# Patient Record
Sex: Female | Born: 1945 | Race: White | Hispanic: No | Marital: Married | Smoking: Never smoker
Health system: Southern US, Community
[De-identification: ages and names within clinical notes are randomized; demographics above are authoritative.]

## PROBLEM LIST (undated history)

## (undated) HISTORY — PX: BREAST BIOPSY: SHX20

---

## 1998-11-14 ENCOUNTER — Other Ambulatory Visit: Admission: RE | Admit: 1998-11-14 | Discharge: 1998-11-14 | Payer: Self-pay | Admitting: Internal Medicine

## 1999-01-02 ENCOUNTER — Other Ambulatory Visit: Admission: RE | Admit: 1999-01-02 | Discharge: 1999-01-02 | Payer: Self-pay | Admitting: Radiology

## 1999-11-27 ENCOUNTER — Other Ambulatory Visit: Admission: RE | Admit: 1999-11-27 | Discharge: 1999-11-27 | Payer: Self-pay | Admitting: Internal Medicine

## 2000-12-09 ENCOUNTER — Other Ambulatory Visit: Admission: RE | Admit: 2000-12-09 | Discharge: 2000-12-09 | Payer: Self-pay | Admitting: Internal Medicine

## 2003-10-06 ENCOUNTER — Ambulatory Visit (HOSPITAL_COMMUNITY): Admission: RE | Admit: 2003-10-06 | Discharge: 2003-10-06 | Payer: Self-pay | Admitting: Internal Medicine

## 2008-09-20 ENCOUNTER — Encounter: Admission: RE | Admit: 2008-09-20 | Discharge: 2008-09-20 | Payer: Self-pay | Admitting: Radiology

## 2013-01-01 ENCOUNTER — Other Ambulatory Visit: Payer: Self-pay | Admitting: Radiology

## 2014-11-15 ENCOUNTER — Other Ambulatory Visit: Payer: Self-pay | Admitting: Internal Medicine

## 2014-11-15 DIAGNOSIS — N814 Uterovaginal prolapse, unspecified: Secondary | ICD-10-CM

## 2014-11-17 ENCOUNTER — Ambulatory Visit
Admission: RE | Admit: 2014-11-17 | Discharge: 2014-11-17 | Disposition: A | Discharge status: Home / Self Care | Payer: PPO | Source: Ambulatory Visit | Attending: Internal Medicine | Admitting: Internal Medicine

## 2014-11-17 DIAGNOSIS — N814 Uterovaginal prolapse, unspecified: Secondary | ICD-10-CM

## 2016-03-20 DIAGNOSIS — Z0000 Encounter for general adult medical examination without abnormal findings: Secondary | ICD-10-CM | POA: Diagnosis not present

## 2016-03-20 DIAGNOSIS — D352 Benign neoplasm of pituitary gland: Secondary | ICD-10-CM | POA: Diagnosis not present

## 2016-03-20 DIAGNOSIS — E781 Pure hyperglyceridemia: Secondary | ICD-10-CM | POA: Diagnosis not present

## 2016-03-27 DIAGNOSIS — I8390 Asymptomatic varicose veins of unspecified lower extremity: Secondary | ICD-10-CM | POA: Diagnosis not present

## 2016-03-27 DIAGNOSIS — M25561 Pain in right knee: Secondary | ICD-10-CM | POA: Diagnosis not present

## 2016-03-27 DIAGNOSIS — H4089 Other specified glaucoma: Secondary | ICD-10-CM | POA: Diagnosis not present

## 2016-03-27 DIAGNOSIS — F325 Major depressive disorder, single episode, in full remission: Secondary | ICD-10-CM | POA: Diagnosis not present

## 2016-03-27 DIAGNOSIS — N814 Uterovaginal prolapse, unspecified: Secondary | ICD-10-CM | POA: Diagnosis not present

## 2016-03-27 DIAGNOSIS — Z1389 Encounter for screening for other disorder: Secondary | ICD-10-CM | POA: Diagnosis not present

## 2016-03-27 DIAGNOSIS — E781 Pure hyperglyceridemia: Secondary | ICD-10-CM | POA: Diagnosis not present

## 2016-03-27 DIAGNOSIS — H9193 Unspecified hearing loss, bilateral: Secondary | ICD-10-CM | POA: Diagnosis not present

## 2016-03-27 DIAGNOSIS — D352 Benign neoplasm of pituitary gland: Secondary | ICD-10-CM | POA: Diagnosis not present

## 2016-03-27 DIAGNOSIS — Z6828 Body mass index (BMI) 28.0-28.9, adult: Secondary | ICD-10-CM | POA: Diagnosis not present

## 2016-03-27 DIAGNOSIS — Z1212 Encounter for screening for malignant neoplasm of rectum: Secondary | ICD-10-CM | POA: Diagnosis not present

## 2016-03-27 DIAGNOSIS — Z0000 Encounter for general adult medical examination without abnormal findings: Secondary | ICD-10-CM | POA: Diagnosis not present

## 2016-03-27 DIAGNOSIS — E663 Overweight: Secondary | ICD-10-CM | POA: Diagnosis not present

## 2016-06-15 ENCOUNTER — Other Ambulatory Visit: Payer: Self-pay | Admitting: Internal Medicine

## 2016-06-15 DIAGNOSIS — Z1231 Encounter for screening mammogram for malignant neoplasm of breast: Secondary | ICD-10-CM

## 2016-07-03 ENCOUNTER — Ambulatory Visit
Admission: RE | Admit: 2016-07-03 | Discharge: 2016-07-03 | Disposition: A | Discharge status: Home / Self Care | Payer: PPO | Source: Ambulatory Visit | Attending: Internal Medicine | Admitting: Internal Medicine

## 2016-07-03 DIAGNOSIS — Z1231 Encounter for screening mammogram for malignant neoplasm of breast: Secondary | ICD-10-CM

## 2016-07-10 ENCOUNTER — Other Ambulatory Visit: Payer: Self-pay | Admitting: Internal Medicine

## 2016-07-10 DIAGNOSIS — R928 Other abnormal and inconclusive findings on diagnostic imaging of breast: Secondary | ICD-10-CM

## 2016-07-13 ENCOUNTER — Ambulatory Visit
Admission: RE | Admit: 2016-07-13 | Discharge: 2016-07-13 | Disposition: A | Discharge status: Home / Self Care | Payer: PPO | Source: Ambulatory Visit | Attending: Internal Medicine | Admitting: Internal Medicine

## 2016-07-13 DIAGNOSIS — R928 Other abnormal and inconclusive findings on diagnostic imaging of breast: Secondary | ICD-10-CM

## 2016-08-08 DIAGNOSIS — H2513 Age-related nuclear cataract, bilateral: Secondary | ICD-10-CM | POA: Diagnosis not present

## 2016-08-08 DIAGNOSIS — H401131 Primary open-angle glaucoma, bilateral, mild stage: Secondary | ICD-10-CM | POA: Diagnosis not present

## 2016-08-08 DIAGNOSIS — H18413 Arcus senilis, bilateral: Secondary | ICD-10-CM | POA: Diagnosis not present

## 2017-01-04 DIAGNOSIS — M1711 Unilateral primary osteoarthritis, right knee: Secondary | ICD-10-CM | POA: Diagnosis not present

## 2017-02-22 DIAGNOSIS — M1711 Unilateral primary osteoarthritis, right knee: Secondary | ICD-10-CM | POA: Diagnosis not present

## 2017-03-21 DIAGNOSIS — M1711 Unilateral primary osteoarthritis, right knee: Secondary | ICD-10-CM | POA: Diagnosis not present

## 2017-04-18 DIAGNOSIS — M810 Age-related osteoporosis without current pathological fracture: Secondary | ICD-10-CM | POA: Diagnosis not present

## 2017-04-18 DIAGNOSIS — F418 Other specified anxiety disorders: Secondary | ICD-10-CM | POA: Diagnosis not present

## 2017-04-18 DIAGNOSIS — N39498 Other specified urinary incontinence: Secondary | ICD-10-CM | POA: Diagnosis not present

## 2017-04-18 DIAGNOSIS — E781 Pure hyperglyceridemia: Secondary | ICD-10-CM | POA: Diagnosis not present

## 2017-04-18 DIAGNOSIS — D352 Benign neoplasm of pituitary gland: Secondary | ICD-10-CM | POA: Diagnosis not present

## 2017-04-25 DIAGNOSIS — D352 Benign neoplasm of pituitary gland: Secondary | ICD-10-CM | POA: Diagnosis not present

## 2017-04-25 DIAGNOSIS — H9190 Unspecified hearing loss, unspecified ear: Secondary | ICD-10-CM | POA: Diagnosis not present

## 2017-04-25 DIAGNOSIS — N814 Uterovaginal prolapse, unspecified: Secondary | ICD-10-CM | POA: Diagnosis not present

## 2017-04-25 DIAGNOSIS — F325 Major depressive disorder, single episode, in full remission: Secondary | ICD-10-CM | POA: Diagnosis not present

## 2017-04-25 DIAGNOSIS — I8390 Asymptomatic varicose veins of unspecified lower extremity: Secondary | ICD-10-CM | POA: Diagnosis not present

## 2017-04-25 DIAGNOSIS — Z1389 Encounter for screening for other disorder: Secondary | ICD-10-CM | POA: Diagnosis not present

## 2017-04-25 DIAGNOSIS — M25561 Pain in right knee: Secondary | ICD-10-CM | POA: Diagnosis not present

## 2017-04-25 DIAGNOSIS — Z0000 Encounter for general adult medical examination without abnormal findings: Secondary | ICD-10-CM | POA: Diagnosis not present

## 2017-04-25 DIAGNOSIS — H4089 Other specified glaucoma: Secondary | ICD-10-CM | POA: Diagnosis not present

## 2017-04-25 DIAGNOSIS — F418 Other specified anxiety disorders: Secondary | ICD-10-CM | POA: Diagnosis not present

## 2017-04-25 DIAGNOSIS — Z6827 Body mass index (BMI) 27.0-27.9, adult: Secondary | ICD-10-CM | POA: Diagnosis not present

## 2017-04-25 DIAGNOSIS — R140 Abdominal distension (gaseous): Secondary | ICD-10-CM | POA: Diagnosis not present

## 2017-04-30 DIAGNOSIS — Z1212 Encounter for screening for malignant neoplasm of rectum: Secondary | ICD-10-CM | POA: Diagnosis not present

## 2017-05-09 DIAGNOSIS — M1711 Unilateral primary osteoarthritis, right knee: Secondary | ICD-10-CM | POA: Diagnosis not present

## 2017-07-09 ENCOUNTER — Other Ambulatory Visit: Payer: Self-pay | Admitting: Internal Medicine

## 2017-07-09 DIAGNOSIS — Z1231 Encounter for screening mammogram for malignant neoplasm of breast: Secondary | ICD-10-CM

## 2017-07-17 ENCOUNTER — Ambulatory Visit
Admission: RE | Admit: 2017-07-17 | Discharge: 2017-07-17 | Disposition: A | Discharge status: Home / Self Care | Payer: PPO | Source: Ambulatory Visit | Attending: Internal Medicine | Admitting: Internal Medicine

## 2017-07-17 DIAGNOSIS — Z1231 Encounter for screening mammogram for malignant neoplasm of breast: Secondary | ICD-10-CM | POA: Diagnosis not present

## 2017-09-24 DIAGNOSIS — D1801 Hemangioma of skin and subcutaneous tissue: Secondary | ICD-10-CM | POA: Diagnosis not present

## 2017-09-24 DIAGNOSIS — L814 Other melanin hyperpigmentation: Secondary | ICD-10-CM | POA: Diagnosis not present

## 2017-09-24 DIAGNOSIS — L821 Other seborrheic keratosis: Secondary | ICD-10-CM | POA: Diagnosis not present

## 2017-09-24 DIAGNOSIS — L570 Actinic keratosis: Secondary | ICD-10-CM | POA: Diagnosis not present

## 2017-09-24 DIAGNOSIS — D225 Melanocytic nevi of trunk: Secondary | ICD-10-CM | POA: Diagnosis not present

## 2017-10-23 DIAGNOSIS — M1711 Unilateral primary osteoarthritis, right knee: Secondary | ICD-10-CM | POA: Diagnosis not present

## 2017-10-30 DIAGNOSIS — M1711 Unilateral primary osteoarthritis, right knee: Secondary | ICD-10-CM | POA: Diagnosis not present

## 2017-11-06 DIAGNOSIS — M1711 Unilateral primary osteoarthritis, right knee: Secondary | ICD-10-CM | POA: Diagnosis not present

## 2017-11-13 DIAGNOSIS — H18413 Arcus senilis, bilateral: Secondary | ICD-10-CM | POA: Diagnosis not present

## 2017-11-13 DIAGNOSIS — H2513 Age-related nuclear cataract, bilateral: Secondary | ICD-10-CM | POA: Diagnosis not present

## 2017-11-13 DIAGNOSIS — H401131 Primary open-angle glaucoma, bilateral, mild stage: Secondary | ICD-10-CM | POA: Diagnosis not present

## 2018-01-24 DIAGNOSIS — Z86010 Personal history of colonic polyps: Secondary | ICD-10-CM | POA: Diagnosis not present

## 2018-04-29 DIAGNOSIS — E781 Pure hyperglyceridemia: Secondary | ICD-10-CM | POA: Diagnosis not present

## 2018-04-29 DIAGNOSIS — M810 Age-related osteoporosis without current pathological fracture: Secondary | ICD-10-CM | POA: Diagnosis not present

## 2018-04-29 DIAGNOSIS — D352 Benign neoplasm of pituitary gland: Secondary | ICD-10-CM | POA: Diagnosis not present

## 2018-04-29 DIAGNOSIS — R82998 Other abnormal findings in urine: Secondary | ICD-10-CM | POA: Diagnosis not present

## 2018-05-06 DIAGNOSIS — Z0000 Encounter for general adult medical examination without abnormal findings: Secondary | ICD-10-CM | POA: Diagnosis not present

## 2018-05-06 DIAGNOSIS — N814 Uterovaginal prolapse, unspecified: Secondary | ICD-10-CM | POA: Diagnosis not present

## 2018-05-06 DIAGNOSIS — L290 Pruritus ani: Secondary | ICD-10-CM | POA: Diagnosis not present

## 2018-05-06 DIAGNOSIS — H9193 Unspecified hearing loss, bilateral: Secondary | ICD-10-CM | POA: Diagnosis not present

## 2018-05-06 DIAGNOSIS — Z23 Encounter for immunization: Secondary | ICD-10-CM | POA: Diagnosis not present

## 2018-05-06 DIAGNOSIS — D352 Benign neoplasm of pituitary gland: Secondary | ICD-10-CM | POA: Diagnosis not present

## 2018-05-06 DIAGNOSIS — B351 Tinea unguium: Secondary | ICD-10-CM | POA: Diagnosis not present

## 2018-05-06 DIAGNOSIS — F418 Other specified anxiety disorders: Secondary | ICD-10-CM | POA: Diagnosis not present

## 2018-05-06 DIAGNOSIS — I8390 Asymptomatic varicose veins of unspecified lower extremity: Secondary | ICD-10-CM | POA: Diagnosis not present

## 2018-05-06 DIAGNOSIS — M25561 Pain in right knee: Secondary | ICD-10-CM | POA: Diagnosis not present

## 2018-05-06 DIAGNOSIS — E663 Overweight: Secondary | ICD-10-CM | POA: Diagnosis not present

## 2018-05-06 DIAGNOSIS — F325 Major depressive disorder, single episode, in full remission: Secondary | ICD-10-CM | POA: Diagnosis not present

## 2018-07-16 DIAGNOSIS — H903 Sensorineural hearing loss, bilateral: Secondary | ICD-10-CM | POA: Diagnosis not present

## 2018-08-21 ENCOUNTER — Other Ambulatory Visit: Payer: Self-pay | Admitting: Internal Medicine

## 2018-08-21 DIAGNOSIS — Z1231 Encounter for screening mammogram for malignant neoplasm of breast: Secondary | ICD-10-CM

## 2018-09-17 DIAGNOSIS — M170 Bilateral primary osteoarthritis of knee: Secondary | ICD-10-CM | POA: Diagnosis not present

## 2018-09-17 DIAGNOSIS — M1711 Unilateral primary osteoarthritis, right knee: Secondary | ICD-10-CM | POA: Diagnosis not present

## 2018-09-17 DIAGNOSIS — M1712 Unilateral primary osteoarthritis, left knee: Secondary | ICD-10-CM | POA: Diagnosis not present

## 2018-09-17 DIAGNOSIS — M25562 Pain in left knee: Secondary | ICD-10-CM | POA: Diagnosis not present

## 2018-09-24 DIAGNOSIS — M1711 Unilateral primary osteoarthritis, right knee: Secondary | ICD-10-CM | POA: Diagnosis not present

## 2018-09-26 ENCOUNTER — Encounter: Payer: Self-pay | Admitting: Radiology

## 2018-09-26 ENCOUNTER — Ambulatory Visit
Admission: RE | Admit: 2018-09-26 | Discharge: 2018-09-26 | Disposition: A | Discharge status: Home / Self Care | Payer: PPO | Source: Ambulatory Visit | Attending: Internal Medicine | Admitting: Internal Medicine

## 2018-09-26 DIAGNOSIS — Z1231 Encounter for screening mammogram for malignant neoplasm of breast: Secondary | ICD-10-CM | POA: Diagnosis not present

## 2018-09-30 ENCOUNTER — Other Ambulatory Visit: Payer: Self-pay | Admitting: Internal Medicine

## 2018-09-30 DIAGNOSIS — R928 Other abnormal and inconclusive findings on diagnostic imaging of breast: Secondary | ICD-10-CM

## 2018-10-01 DIAGNOSIS — M1711 Unilateral primary osteoarthritis, right knee: Secondary | ICD-10-CM | POA: Diagnosis not present

## 2018-10-06 ENCOUNTER — Ambulatory Visit
Admission: RE | Admit: 2018-10-06 | Discharge: 2018-10-06 | Disposition: A | Discharge status: Home / Self Care | Payer: PPO | Source: Ambulatory Visit | Attending: Internal Medicine | Admitting: Internal Medicine

## 2018-10-06 DIAGNOSIS — R928 Other abnormal and inconclusive findings on diagnostic imaging of breast: Secondary | ICD-10-CM

## 2018-10-06 DIAGNOSIS — N6002 Solitary cyst of left breast: Secondary | ICD-10-CM | POA: Diagnosis not present

## 2018-10-22 DIAGNOSIS — M1712 Unilateral primary osteoarthritis, left knee: Secondary | ICD-10-CM | POA: Diagnosis not present

## 2018-10-29 DIAGNOSIS — M1712 Unilateral primary osteoarthritis, left knee: Secondary | ICD-10-CM | POA: Diagnosis not present

## 2018-11-06 DIAGNOSIS — M1712 Unilateral primary osteoarthritis, left knee: Secondary | ICD-10-CM | POA: Diagnosis not present

## 2018-12-01 DIAGNOSIS — L989 Disorder of the skin and subcutaneous tissue, unspecified: Secondary | ICD-10-CM | POA: Diagnosis not present

## 2018-12-01 DIAGNOSIS — L720 Epidermal cyst: Secondary | ICD-10-CM | POA: Diagnosis not present

## 2018-12-01 DIAGNOSIS — D1801 Hemangioma of skin and subcutaneous tissue: Secondary | ICD-10-CM | POA: Diagnosis not present

## 2018-12-01 DIAGNOSIS — L821 Other seborrheic keratosis: Secondary | ICD-10-CM | POA: Diagnosis not present

## 2018-12-01 DIAGNOSIS — D225 Melanocytic nevi of trunk: Secondary | ICD-10-CM | POA: Diagnosis not present

## 2018-12-01 DIAGNOSIS — C44311 Basal cell carcinoma of skin of nose: Secondary | ICD-10-CM | POA: Diagnosis not present

## 2018-12-01 DIAGNOSIS — D485 Neoplasm of uncertain behavior of skin: Secondary | ICD-10-CM | POA: Diagnosis not present

## 2019-04-16 DIAGNOSIS — R351 Nocturia: Secondary | ICD-10-CM | POA: Diagnosis not present

## 2019-04-16 DIAGNOSIS — N8111 Cystocele, midline: Secondary | ICD-10-CM | POA: Diagnosis not present

## 2019-04-16 DIAGNOSIS — R350 Frequency of micturition: Secondary | ICD-10-CM | POA: Diagnosis not present

## 2019-04-16 DIAGNOSIS — N3946 Mixed incontinence: Secondary | ICD-10-CM | POA: Diagnosis not present

## 2019-05-01 DIAGNOSIS — E781 Pure hyperglyceridemia: Secondary | ICD-10-CM | POA: Diagnosis not present

## 2019-05-01 DIAGNOSIS — M810 Age-related osteoporosis without current pathological fracture: Secondary | ICD-10-CM | POA: Diagnosis not present

## 2019-05-01 DIAGNOSIS — R7989 Other specified abnormal findings of blood chemistry: Secondary | ICD-10-CM | POA: Diagnosis not present

## 2019-05-04 DIAGNOSIS — D352 Benign neoplasm of pituitary gland: Secondary | ICD-10-CM | POA: Diagnosis not present

## 2019-05-05 DIAGNOSIS — R82998 Other abnormal findings in urine: Secondary | ICD-10-CM | POA: Diagnosis not present

## 2019-05-08 DIAGNOSIS — E663 Overweight: Secondary | ICD-10-CM | POA: Diagnosis not present

## 2019-05-08 DIAGNOSIS — Z1331 Encounter for screening for depression: Secondary | ICD-10-CM | POA: Diagnosis not present

## 2019-05-08 DIAGNOSIS — M1990 Unspecified osteoarthritis, unspecified site: Secondary | ICD-10-CM | POA: Diagnosis not present

## 2019-05-08 DIAGNOSIS — Z0000 Encounter for general adult medical examination without abnormal findings: Secondary | ICD-10-CM | POA: Diagnosis not present

## 2019-05-08 DIAGNOSIS — F419 Anxiety disorder, unspecified: Secondary | ICD-10-CM | POA: Diagnosis not present

## 2019-05-08 DIAGNOSIS — F325 Major depressive disorder, single episode, in full remission: Secondary | ICD-10-CM | POA: Diagnosis not present

## 2019-05-08 DIAGNOSIS — N814 Uterovaginal prolapse, unspecified: Secondary | ICD-10-CM | POA: Diagnosis not present

## 2019-05-08 DIAGNOSIS — M1712 Unilateral primary osteoarthritis, left knee: Secondary | ICD-10-CM | POA: Diagnosis not present

## 2019-05-08 DIAGNOSIS — H9190 Unspecified hearing loss, unspecified ear: Secondary | ICD-10-CM | POA: Diagnosis not present

## 2019-05-08 DIAGNOSIS — R140 Abdominal distension (gaseous): Secondary | ICD-10-CM | POA: Diagnosis not present

## 2019-05-08 DIAGNOSIS — M1711 Unilateral primary osteoarthritis, right knee: Secondary | ICD-10-CM | POA: Diagnosis not present

## 2019-05-08 DIAGNOSIS — D352 Benign neoplasm of pituitary gland: Secondary | ICD-10-CM | POA: Diagnosis not present

## 2019-05-08 DIAGNOSIS — M170 Bilateral primary osteoarthritis of knee: Secondary | ICD-10-CM | POA: Diagnosis not present

## 2019-05-08 DIAGNOSIS — H409 Unspecified glaucoma: Secondary | ICD-10-CM | POA: Diagnosis not present

## 2019-05-08 DIAGNOSIS — I8390 Asymptomatic varicose veins of unspecified lower extremity: Secondary | ICD-10-CM | POA: Diagnosis not present

## 2019-05-08 DIAGNOSIS — E781 Pure hyperglyceridemia: Secondary | ICD-10-CM | POA: Diagnosis not present

## 2019-05-08 DIAGNOSIS — Z1339 Encounter for screening examination for other mental health and behavioral disorders: Secondary | ICD-10-CM | POA: Diagnosis not present

## 2019-05-14 DIAGNOSIS — M25561 Pain in right knee: Secondary | ICD-10-CM | POA: Diagnosis not present

## 2019-05-14 DIAGNOSIS — M170 Bilateral primary osteoarthritis of knee: Secondary | ICD-10-CM | POA: Diagnosis not present

## 2019-05-14 DIAGNOSIS — M1712 Unilateral primary osteoarthritis, left knee: Secondary | ICD-10-CM | POA: Diagnosis not present

## 2019-05-14 DIAGNOSIS — M25562 Pain in left knee: Secondary | ICD-10-CM | POA: Diagnosis not present

## 2019-05-14 DIAGNOSIS — M1711 Unilateral primary osteoarthritis, right knee: Secondary | ICD-10-CM | POA: Diagnosis not present

## 2019-05-22 DIAGNOSIS — M25562 Pain in left knee: Secondary | ICD-10-CM | POA: Diagnosis not present

## 2019-05-22 DIAGNOSIS — M170 Bilateral primary osteoarthritis of knee: Secondary | ICD-10-CM | POA: Diagnosis not present

## 2019-05-22 DIAGNOSIS — M1712 Unilateral primary osteoarthritis, left knee: Secondary | ICD-10-CM | POA: Diagnosis not present

## 2019-05-22 DIAGNOSIS — M25561 Pain in right knee: Secondary | ICD-10-CM | POA: Diagnosis not present

## 2019-05-22 DIAGNOSIS — M1711 Unilateral primary osteoarthritis, right knee: Secondary | ICD-10-CM | POA: Diagnosis not present

## 2019-05-26 DIAGNOSIS — H903 Sensorineural hearing loss, bilateral: Secondary | ICD-10-CM | POA: Diagnosis not present

## 2019-08-24 DIAGNOSIS — H401131 Primary open-angle glaucoma, bilateral, mild stage: Secondary | ICD-10-CM | POA: Diagnosis not present

## 2019-08-24 DIAGNOSIS — H2513 Age-related nuclear cataract, bilateral: Secondary | ICD-10-CM | POA: Diagnosis not present

## 2019-09-07 ENCOUNTER — Other Ambulatory Visit: Payer: Self-pay | Admitting: Internal Medicine

## 2019-09-07 DIAGNOSIS — N6002 Solitary cyst of left breast: Secondary | ICD-10-CM

## 2019-10-07 ENCOUNTER — Other Ambulatory Visit: Payer: Self-pay

## 2019-10-07 ENCOUNTER — Ambulatory Visit
Admission: RE | Admit: 2019-10-07 | Discharge: 2019-10-07 | Disposition: A | Discharge status: Home / Self Care | Payer: PPO | Source: Ambulatory Visit | Attending: Internal Medicine | Admitting: Internal Medicine

## 2019-10-07 DIAGNOSIS — R928 Other abnormal and inconclusive findings on diagnostic imaging of breast: Secondary | ICD-10-CM | POA: Diagnosis not present

## 2019-10-07 DIAGNOSIS — N6002 Solitary cyst of left breast: Secondary | ICD-10-CM

## 2020-03-15 DIAGNOSIS — L821 Other seborrheic keratosis: Secondary | ICD-10-CM | POA: Diagnosis not present

## 2020-03-15 DIAGNOSIS — Z85828 Personal history of other malignant neoplasm of skin: Secondary | ICD-10-CM | POA: Diagnosis not present

## 2020-03-15 DIAGNOSIS — D225 Melanocytic nevi of trunk: Secondary | ICD-10-CM | POA: Diagnosis not present

## 2020-03-15 DIAGNOSIS — D485 Neoplasm of uncertain behavior of skin: Secondary | ICD-10-CM | POA: Diagnosis not present

## 2020-03-15 DIAGNOSIS — L720 Epidermal cyst: Secondary | ICD-10-CM | POA: Diagnosis not present

## 2020-03-15 DIAGNOSIS — C44319 Basal cell carcinoma of skin of other parts of face: Secondary | ICD-10-CM | POA: Diagnosis not present

## 2020-03-15 DIAGNOSIS — L905 Scar conditions and fibrosis of skin: Secondary | ICD-10-CM | POA: Diagnosis not present

## 2020-03-15 DIAGNOSIS — L570 Actinic keratosis: Secondary | ICD-10-CM | POA: Diagnosis not present

## 2020-05-04 ENCOUNTER — Institutional Professional Consult (permissible substitution): Payer: PPO | Admitting: Plastic Surgery

## 2020-05-12 DIAGNOSIS — C44319 Basal cell carcinoma of skin of other parts of face: Secondary | ICD-10-CM | POA: Diagnosis not present

## 2020-07-06 ENCOUNTER — Institutional Professional Consult (permissible substitution): Payer: PPO | Admitting: Plastic Surgery

## 2020-07-28 ENCOUNTER — Institutional Professional Consult (permissible substitution): Payer: PPO | Admitting: Plastic Surgery

## 2020-08-26 ENCOUNTER — Other Ambulatory Visit: Payer: Self-pay | Admitting: Internal Medicine

## 2020-08-26 DIAGNOSIS — Z1231 Encounter for screening mammogram for malignant neoplasm of breast: Secondary | ICD-10-CM

## 2020-08-31 ENCOUNTER — Encounter: Payer: Self-pay | Admitting: Plastic Surgery

## 2020-08-31 ENCOUNTER — Other Ambulatory Visit: Payer: Self-pay

## 2020-08-31 ENCOUNTER — Ambulatory Visit: Payer: PPO | Admitting: Plastic Surgery

## 2020-08-31 VITALS — BP 143/84 | HR 80 | Temp 97.9°F | Ht 64.0 in | Wt 154.8 lb

## 2020-08-31 DIAGNOSIS — L723 Sebaceous cyst: Secondary | ICD-10-CM | POA: Diagnosis not present

## 2020-08-31 NOTE — Progress Notes (Signed)
   Referring Provider Shon Baton, MD [address] [place],  [place] [address]   CC:  Chief Complaint  Patient presents with  . Advice Only      Tina Castillo is an 74 y.o. female.  HPI: Patient presents with a lesion on her left neck.  Is been present for years.  Intermittently she would squeeze it and she would get some thick fluid from it and then at one point it grew very large in size and she drained it herself with a needle actually.  Since then it has gotten quite a bit smaller but is still bothering her and she is afraid of it getting infected and getting bigger again in the future.  She would like to have it removed.  She has had several cyst removed from her scalp.  No Known Allergies  Outpatient Encounter Medications as of 08/31/2020  Medication Sig  . Multiple Vitamin (MULTIVITAMIN) tablet Take 1 tablet by mouth every other day.  Marland Kitchen OVER THE COUNTER MEDICATION Liquid Turmic-Daily  . OVER THE COUNTER MEDICATION Magnesium 250mg -Take 1 tablet by mouth 2x/week   No facility-administered encounter medications on file as of 08/31/2020.     No past medical history on file.  No past surgical history on file.  No family history on file.  Social History   Social History Narrative  . Not on file     Review of Systems General: Denies fevers, chills, weight loss CV: Denies chest pain, shortness of breath, palpitations  Physical Exam Vitals with BMI 08/31/2020  Height 5\' 4"   Weight 154 lbs 13 oz  BMI 00.45  Systolic 997  Diastolic 84  Pulse 80    General:  No acute distress,  Alert and oriented, Non-Toxic, Normal speech and affect Examination shows a 2.5 cm cystic lesion of the left neck.  This feels just beneath the skin and it has a bit of a punctum centrally.  There is no other surrounding fluctuance or skin changes.  Assessment/Plan Patient presents with a superficial cystic lesion in the left neck.  We discussed excision.  We discussed the risk include  bleeding, infection, damage to surrounding structures and need for additional procedures.  All of her questions were answered and we will plan to excise this in the office under local.  Cindra Presume 08/31/2020, 9:44 AM

## 2020-09-23 DIAGNOSIS — E781 Pure hyperglyceridemia: Secondary | ICD-10-CM | POA: Diagnosis not present

## 2020-09-23 DIAGNOSIS — E559 Vitamin D deficiency, unspecified: Secondary | ICD-10-CM | POA: Diagnosis not present

## 2020-09-30 DIAGNOSIS — D352 Benign neoplasm of pituitary gland: Secondary | ICD-10-CM | POA: Diagnosis not present

## 2020-09-30 DIAGNOSIS — M810 Age-related osteoporosis without current pathological fracture: Secondary | ICD-10-CM | POA: Diagnosis not present

## 2020-09-30 DIAGNOSIS — N39498 Other specified urinary incontinence: Secondary | ICD-10-CM | POA: Diagnosis not present

## 2020-09-30 DIAGNOSIS — Z23 Encounter for immunization: Secondary | ICD-10-CM | POA: Diagnosis not present

## 2020-09-30 DIAGNOSIS — Z0000 Encounter for general adult medical examination without abnormal findings: Secondary | ICD-10-CM | POA: Diagnosis not present

## 2020-09-30 DIAGNOSIS — E663 Overweight: Secondary | ICD-10-CM | POA: Diagnosis not present

## 2020-09-30 DIAGNOSIS — F325 Major depressive disorder, single episode, in full remission: Secondary | ICD-10-CM | POA: Diagnosis not present

## 2020-09-30 DIAGNOSIS — E781 Pure hyperglyceridemia: Secondary | ICD-10-CM | POA: Diagnosis not present

## 2020-09-30 DIAGNOSIS — R82998 Other abnormal findings in urine: Secondary | ICD-10-CM | POA: Diagnosis not present

## 2020-09-30 DIAGNOSIS — M1990 Unspecified osteoarthritis, unspecified site: Secondary | ICD-10-CM | POA: Diagnosis not present

## 2020-09-30 DIAGNOSIS — N814 Uterovaginal prolapse, unspecified: Secondary | ICD-10-CM | POA: Diagnosis not present

## 2020-09-30 DIAGNOSIS — F419 Anxiety disorder, unspecified: Secondary | ICD-10-CM | POA: Diagnosis not present

## 2020-10-05 DIAGNOSIS — Z1212 Encounter for screening for malignant neoplasm of rectum: Secondary | ICD-10-CM | POA: Diagnosis not present

## 2020-10-11 ENCOUNTER — Ambulatory Visit
Admission: RE | Admit: 2020-10-11 | Discharge: 2020-10-11 | Disposition: A | Discharge status: Home / Self Care | Payer: PPO | Source: Ambulatory Visit | Attending: Internal Medicine | Admitting: Internal Medicine

## 2020-10-11 ENCOUNTER — Other Ambulatory Visit: Payer: Self-pay

## 2020-10-11 DIAGNOSIS — Z1231 Encounter for screening mammogram for malignant neoplasm of breast: Secondary | ICD-10-CM | POA: Diagnosis not present

## 2020-10-12 DIAGNOSIS — L718 Other rosacea: Secondary | ICD-10-CM | POA: Diagnosis not present

## 2020-10-12 DIAGNOSIS — L905 Scar conditions and fibrosis of skin: Secondary | ICD-10-CM | POA: Diagnosis not present

## 2020-10-12 DIAGNOSIS — D225 Melanocytic nevi of trunk: Secondary | ICD-10-CM | POA: Diagnosis not present

## 2020-10-12 DIAGNOSIS — L570 Actinic keratosis: Secondary | ICD-10-CM | POA: Diagnosis not present

## 2020-10-12 DIAGNOSIS — Z85828 Personal history of other malignant neoplasm of skin: Secondary | ICD-10-CM | POA: Diagnosis not present

## 2020-10-12 DIAGNOSIS — L821 Other seborrheic keratosis: Secondary | ICD-10-CM | POA: Diagnosis not present

## 2020-10-13 ENCOUNTER — Ambulatory Visit: Payer: PPO | Admitting: Plastic Surgery

## 2020-10-18 DIAGNOSIS — H401131 Primary open-angle glaucoma, bilateral, mild stage: Secondary | ICD-10-CM | POA: Diagnosis not present

## 2020-10-18 DIAGNOSIS — H2513 Age-related nuclear cataract, bilateral: Secondary | ICD-10-CM | POA: Diagnosis not present

## 2021-01-23 DIAGNOSIS — M170 Bilateral primary osteoarthritis of knee: Secondary | ICD-10-CM | POA: Diagnosis not present

## 2021-01-23 DIAGNOSIS — M1711 Unilateral primary osteoarthritis, right knee: Secondary | ICD-10-CM | POA: Diagnosis not present

## 2021-01-23 DIAGNOSIS — M1712 Unilateral primary osteoarthritis, left knee: Secondary | ICD-10-CM | POA: Diagnosis not present

## 2021-02-03 DIAGNOSIS — M1712 Unilateral primary osteoarthritis, left knee: Secondary | ICD-10-CM | POA: Diagnosis not present

## 2021-02-17 DIAGNOSIS — E781 Pure hyperglyceridemia: Secondary | ICD-10-CM | POA: Diagnosis not present

## 2021-02-17 DIAGNOSIS — Z01810 Encounter for preprocedural cardiovascular examination: Secondary | ICD-10-CM | POA: Diagnosis not present

## 2021-02-17 DIAGNOSIS — I8390 Asymptomatic varicose veins of unspecified lower extremity: Secondary | ICD-10-CM | POA: Diagnosis not present

## 2021-02-17 DIAGNOSIS — R030 Elevated blood-pressure reading, without diagnosis of hypertension: Secondary | ICD-10-CM | POA: Diagnosis not present

## 2021-02-17 DIAGNOSIS — D352 Benign neoplasm of pituitary gland: Secondary | ICD-10-CM | POA: Diagnosis not present

## 2021-02-17 DIAGNOSIS — N39498 Other specified urinary incontinence: Secondary | ICD-10-CM | POA: Diagnosis not present

## 2021-02-17 DIAGNOSIS — R002 Palpitations: Secondary | ICD-10-CM | POA: Diagnosis not present

## 2021-02-17 DIAGNOSIS — M1712 Unilateral primary osteoarthritis, left knee: Secondary | ICD-10-CM | POA: Diagnosis not present

## 2021-02-17 DIAGNOSIS — E663 Overweight: Secondary | ICD-10-CM | POA: Diagnosis not present

## 2021-02-17 DIAGNOSIS — N814 Uterovaginal prolapse, unspecified: Secondary | ICD-10-CM | POA: Diagnosis not present

## 2021-02-17 DIAGNOSIS — Z01818 Encounter for other preprocedural examination: Secondary | ICD-10-CM | POA: Diagnosis not present

## 2021-02-17 DIAGNOSIS — M1990 Unspecified osteoarthritis, unspecified site: Secondary | ICD-10-CM | POA: Diagnosis not present

## 2021-02-17 DIAGNOSIS — M810 Age-related osteoporosis without current pathological fracture: Secondary | ICD-10-CM | POA: Diagnosis not present

## 2021-02-21 DIAGNOSIS — M25562 Pain in left knee: Secondary | ICD-10-CM | POA: Diagnosis not present

## 2021-02-21 DIAGNOSIS — M25662 Stiffness of left knee, not elsewhere classified: Secondary | ICD-10-CM | POA: Diagnosis not present

## 2021-02-21 DIAGNOSIS — M1712 Unilateral primary osteoarthritis, left knee: Secondary | ICD-10-CM | POA: Diagnosis not present

## 2021-03-28 DIAGNOSIS — M1712 Unilateral primary osteoarthritis, left knee: Secondary | ICD-10-CM | POA: Diagnosis not present

## 2021-03-28 DIAGNOSIS — G8918 Other acute postprocedural pain: Secondary | ICD-10-CM | POA: Diagnosis not present

## 2021-03-31 DIAGNOSIS — M25562 Pain in left knee: Secondary | ICD-10-CM | POA: Diagnosis not present

## 2021-03-31 DIAGNOSIS — M25662 Stiffness of left knee, not elsewhere classified: Secondary | ICD-10-CM | POA: Diagnosis not present

## 2021-04-04 DIAGNOSIS — M25662 Stiffness of left knee, not elsewhere classified: Secondary | ICD-10-CM | POA: Diagnosis not present

## 2021-04-04 DIAGNOSIS — M25562 Pain in left knee: Secondary | ICD-10-CM | POA: Diagnosis not present

## 2021-04-07 DIAGNOSIS — M25562 Pain in left knee: Secondary | ICD-10-CM | POA: Diagnosis not present

## 2021-04-07 DIAGNOSIS — M25662 Stiffness of left knee, not elsewhere classified: Secondary | ICD-10-CM | POA: Diagnosis not present

## 2021-04-11 DIAGNOSIS — M25562 Pain in left knee: Secondary | ICD-10-CM | POA: Diagnosis not present

## 2021-04-11 DIAGNOSIS — M25662 Stiffness of left knee, not elsewhere classified: Secondary | ICD-10-CM | POA: Diagnosis not present

## 2021-04-13 DIAGNOSIS — M25662 Stiffness of left knee, not elsewhere classified: Secondary | ICD-10-CM | POA: Diagnosis not present

## 2021-04-13 DIAGNOSIS — M25562 Pain in left knee: Secondary | ICD-10-CM | POA: Diagnosis not present

## 2021-04-17 DIAGNOSIS — M25562 Pain in left knee: Secondary | ICD-10-CM | POA: Diagnosis not present

## 2021-04-17 DIAGNOSIS — M25662 Stiffness of left knee, not elsewhere classified: Secondary | ICD-10-CM | POA: Diagnosis not present

## 2021-04-19 DIAGNOSIS — M25562 Pain in left knee: Secondary | ICD-10-CM | POA: Diagnosis not present

## 2021-04-19 DIAGNOSIS — M25662 Stiffness of left knee, not elsewhere classified: Secondary | ICD-10-CM | POA: Diagnosis not present

## 2021-04-24 DIAGNOSIS — M25562 Pain in left knee: Secondary | ICD-10-CM | POA: Diagnosis not present

## 2021-04-24 DIAGNOSIS — M25662 Stiffness of left knee, not elsewhere classified: Secondary | ICD-10-CM | POA: Diagnosis not present

## 2021-04-27 DIAGNOSIS — M25562 Pain in left knee: Secondary | ICD-10-CM | POA: Diagnosis not present

## 2021-04-27 DIAGNOSIS — M25662 Stiffness of left knee, not elsewhere classified: Secondary | ICD-10-CM | POA: Diagnosis not present

## 2021-05-01 DIAGNOSIS — M25662 Stiffness of left knee, not elsewhere classified: Secondary | ICD-10-CM | POA: Diagnosis not present

## 2021-05-01 DIAGNOSIS — M25562 Pain in left knee: Secondary | ICD-10-CM | POA: Diagnosis not present

## 2021-05-04 DIAGNOSIS — M25562 Pain in left knee: Secondary | ICD-10-CM | POA: Diagnosis not present

## 2021-05-04 DIAGNOSIS — M25662 Stiffness of left knee, not elsewhere classified: Secondary | ICD-10-CM | POA: Diagnosis not present

## 2021-05-09 DIAGNOSIS — Z471 Aftercare following joint replacement surgery: Secondary | ICD-10-CM | POA: Diagnosis not present

## 2021-05-09 DIAGNOSIS — Z96652 Presence of left artificial knee joint: Secondary | ICD-10-CM | POA: Diagnosis not present

## 2021-06-21 IMAGING — MG DIGITAL SCREENING BILAT W/ TOMO W/ CAD
6 of 10 series · 6 of 30 positions shown · non-contrast
Comparison: Previous exam(s).

CLINICAL DATA: Screening.

EXAM:
DIGITAL SCREENING BILATERAL MAMMOGRAM WITH TOMO AND CAD

[R MLO synth-2D (1 of 2)]
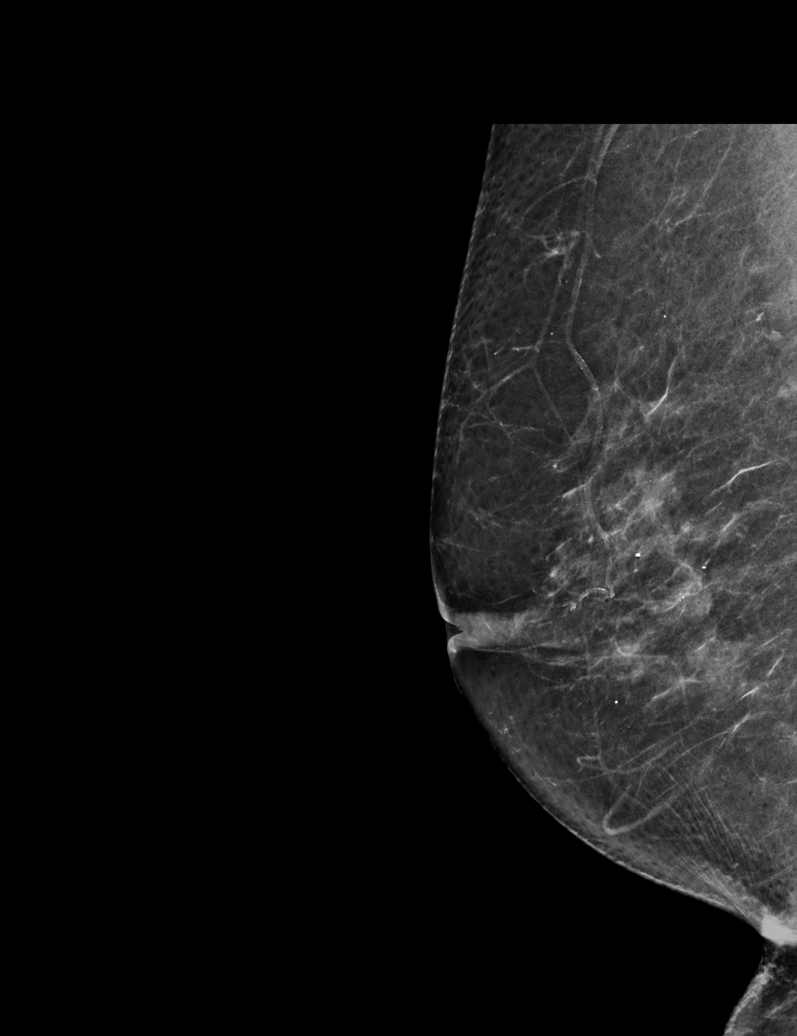

[R CC synth-2D]
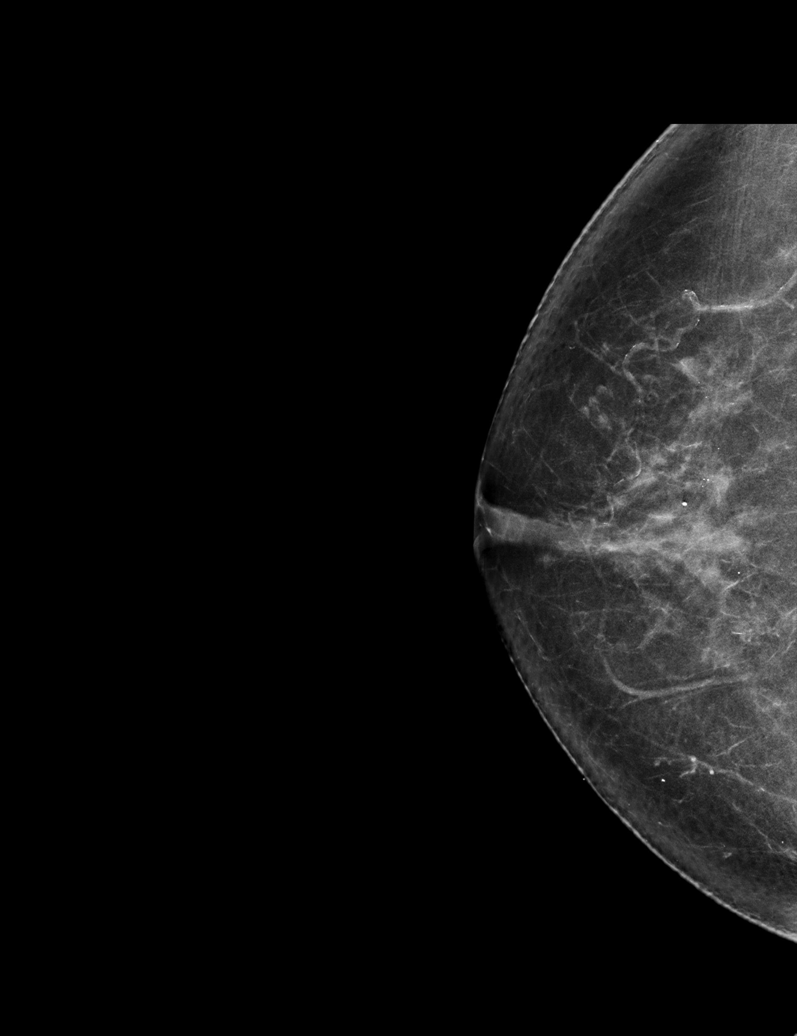

[L CC synth-2D]
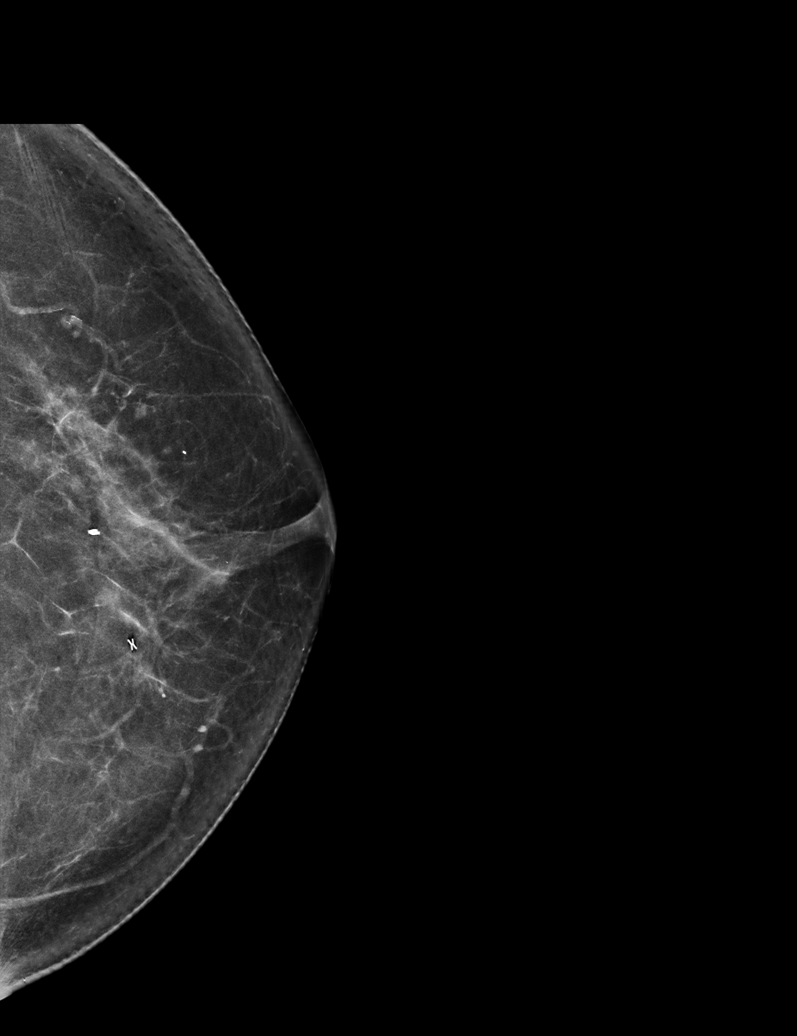

[R MLO synth-2D (2 of 2)]
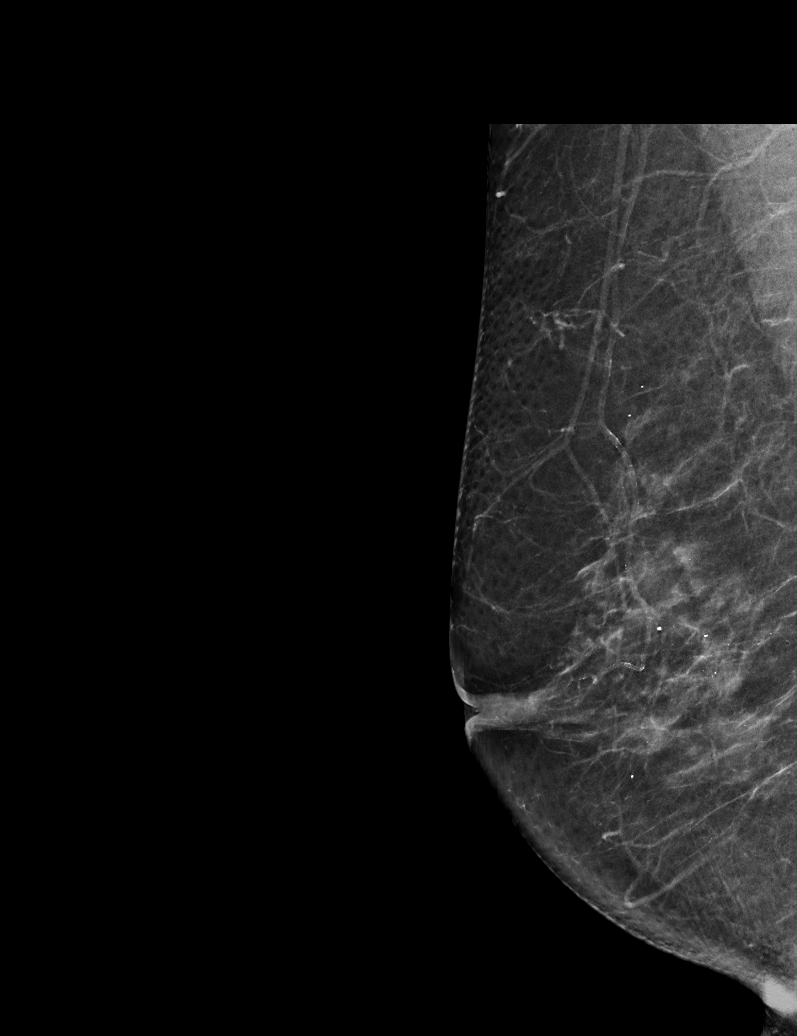

[L MLO synth-2D]
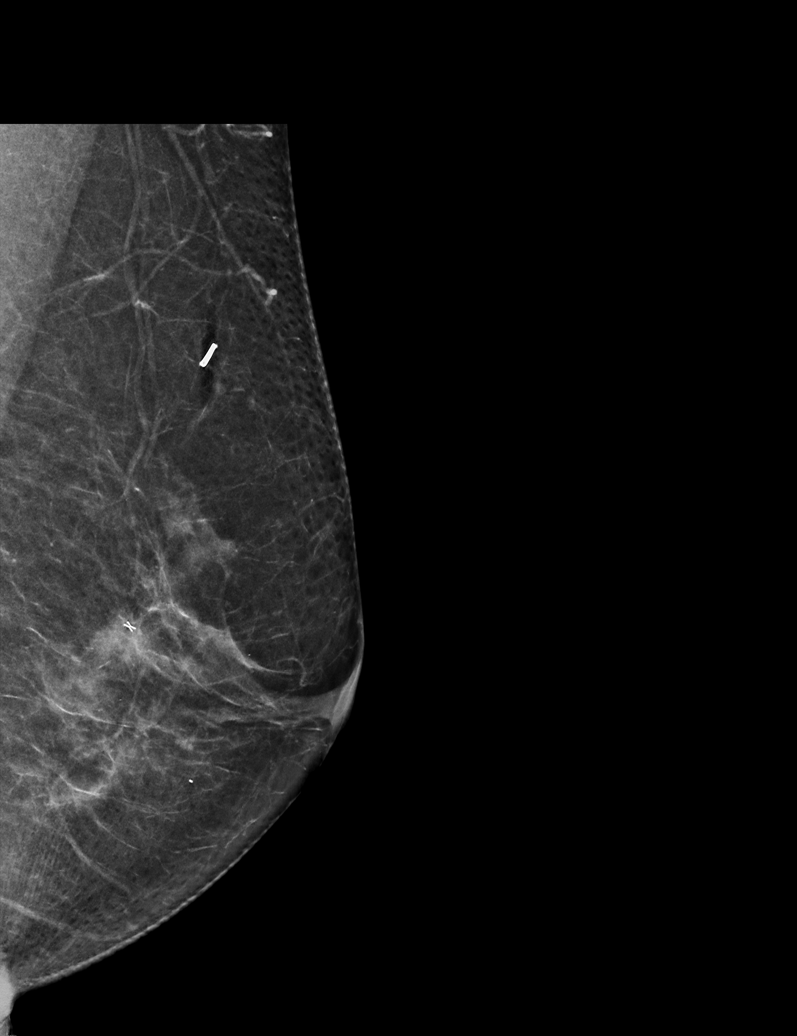

[R MLO tomo · tomo slice 31/62.0]
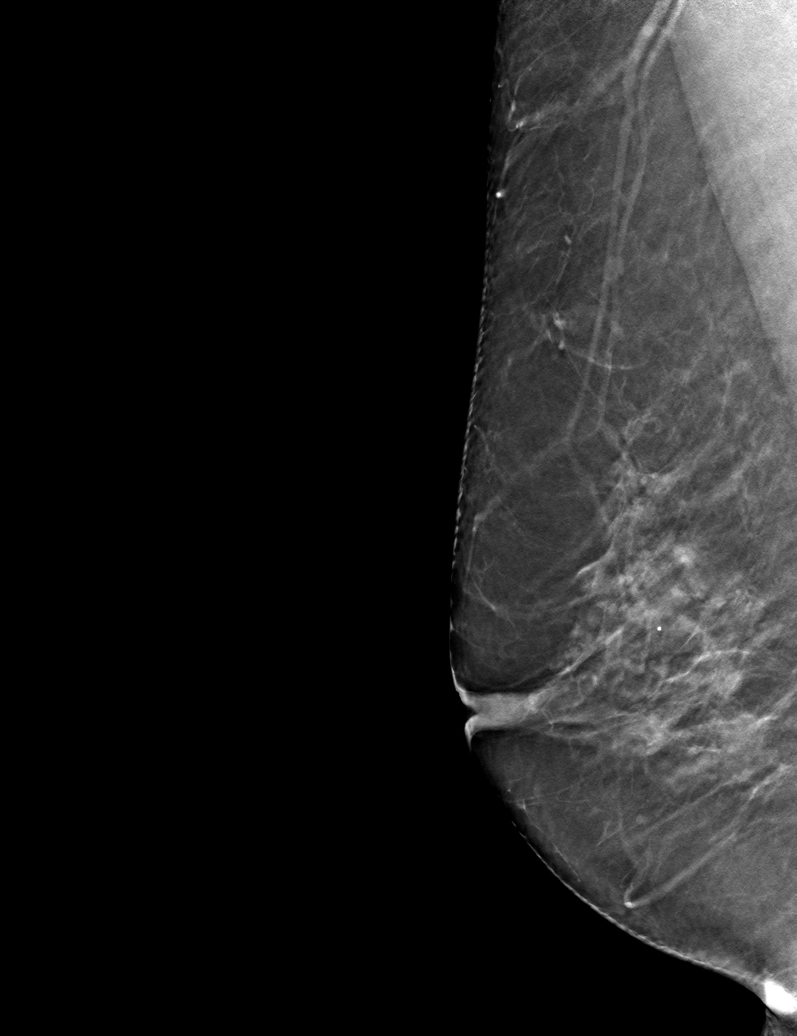

[6 of 30 positions shown; findings below may reference images not displayed]

ACR Breast Density Category b: There are scattered areas of
fibroglandular density.
FINDINGS: There are no findings suspicious for malignancy. Images were
processed with CAD.
IMPRESSION: No mammographic evidence of malignancy. A result letter of this
screening mammogram will be mailed directly to the patient.

RECOMMENDATION:
Screening mammogram in one year. (Code:[id number])

BI-RADS CATEGORY  1: Negative.

## 2021-09-25 ENCOUNTER — Other Ambulatory Visit: Payer: Self-pay | Admitting: Internal Medicine

## 2021-09-25 DIAGNOSIS — Z1231 Encounter for screening mammogram for malignant neoplasm of breast: Secondary | ICD-10-CM

## 2021-10-09 DIAGNOSIS — D352 Benign neoplasm of pituitary gland: Secondary | ICD-10-CM | POA: Diagnosis not present

## 2021-10-09 DIAGNOSIS — E781 Pure hyperglyceridemia: Secondary | ICD-10-CM | POA: Diagnosis not present

## 2021-10-09 DIAGNOSIS — H9193 Unspecified hearing loss, bilateral: Secondary | ICD-10-CM | POA: Diagnosis not present

## 2021-10-09 DIAGNOSIS — E559 Vitamin D deficiency, unspecified: Secondary | ICD-10-CM | POA: Diagnosis not present

## 2021-10-09 DIAGNOSIS — H6123 Impacted cerumen, bilateral: Secondary | ICD-10-CM | POA: Diagnosis not present

## 2021-10-09 DIAGNOSIS — R030 Elevated blood-pressure reading, without diagnosis of hypertension: Secondary | ICD-10-CM | POA: Diagnosis not present

## 2021-10-13 DIAGNOSIS — D352 Benign neoplasm of pituitary gland: Secondary | ICD-10-CM | POA: Diagnosis not present

## 2021-10-13 DIAGNOSIS — F419 Anxiety disorder, unspecified: Secondary | ICD-10-CM | POA: Diagnosis not present

## 2021-10-13 DIAGNOSIS — M810 Age-related osteoporosis without current pathological fracture: Secondary | ICD-10-CM | POA: Diagnosis not present

## 2021-10-13 DIAGNOSIS — Z1212 Encounter for screening for malignant neoplasm of rectum: Secondary | ICD-10-CM | POA: Diagnosis not present

## 2021-10-13 DIAGNOSIS — M1712 Unilateral primary osteoarthritis, left knee: Secondary | ICD-10-CM | POA: Diagnosis not present

## 2021-10-13 DIAGNOSIS — Z1339 Encounter for screening examination for other mental health and behavioral disorders: Secondary | ICD-10-CM | POA: Diagnosis not present

## 2021-10-13 DIAGNOSIS — R82998 Other abnormal findings in urine: Secondary | ICD-10-CM | POA: Diagnosis not present

## 2021-10-13 DIAGNOSIS — M1990 Unspecified osteoarthritis, unspecified site: Secondary | ICD-10-CM | POA: Diagnosis not present

## 2021-10-13 DIAGNOSIS — E781 Pure hyperglyceridemia: Secondary | ICD-10-CM | POA: Diagnosis not present

## 2021-10-13 DIAGNOSIS — E663 Overweight: Secondary | ICD-10-CM | POA: Diagnosis not present

## 2021-10-13 DIAGNOSIS — Z0000 Encounter for general adult medical examination without abnormal findings: Secondary | ICD-10-CM | POA: Diagnosis not present

## 2021-10-13 DIAGNOSIS — Z23 Encounter for immunization: Secondary | ICD-10-CM | POA: Diagnosis not present

## 2021-10-13 DIAGNOSIS — Z1331 Encounter for screening for depression: Secondary | ICD-10-CM | POA: Diagnosis not present

## 2021-10-31 ENCOUNTER — Ambulatory Visit
Admission: RE | Admit: 2021-10-31 | Discharge: 2021-10-31 | Disposition: A | Discharge status: Home / Self Care | Payer: PPO | Source: Ambulatory Visit | Attending: Internal Medicine | Admitting: Internal Medicine

## 2021-10-31 DIAGNOSIS — Z1231 Encounter for screening mammogram for malignant neoplasm of breast: Secondary | ICD-10-CM | POA: Diagnosis not present

## 2021-11-09 DIAGNOSIS — H401131 Primary open-angle glaucoma, bilateral, mild stage: Secondary | ICD-10-CM | POA: Diagnosis not present

## 2021-11-09 DIAGNOSIS — H2513 Age-related nuclear cataract, bilateral: Secondary | ICD-10-CM | POA: Diagnosis not present

## 2022-03-30 DIAGNOSIS — Z96652 Presence of left artificial knee joint: Secondary | ICD-10-CM | POA: Diagnosis not present

## 2022-06-19 DIAGNOSIS — H9193 Unspecified hearing loss, bilateral: Secondary | ICD-10-CM | POA: Diagnosis not present

## 2022-06-19 DIAGNOSIS — D2310 Other benign neoplasm of skin of unspecified eyelid, including canthus: Secondary | ICD-10-CM | POA: Diagnosis not present

## 2022-06-19 DIAGNOSIS — R600 Localized edema: Secondary | ICD-10-CM | POA: Diagnosis not present

## 2022-06-19 DIAGNOSIS — M2031 Hallux varus (acquired), right foot: Secondary | ICD-10-CM | POA: Diagnosis not present

## 2022-06-28 DIAGNOSIS — D492 Neoplasm of unspecified behavior of bone, soft tissue, and skin: Secondary | ICD-10-CM | POA: Diagnosis not present

## 2022-09-26 ENCOUNTER — Other Ambulatory Visit: Payer: Self-pay | Admitting: Internal Medicine

## 2022-09-26 DIAGNOSIS — Z1231 Encounter for screening mammogram for malignant neoplasm of breast: Secondary | ICD-10-CM

## 2022-09-26 DIAGNOSIS — H2513 Age-related nuclear cataract, bilateral: Secondary | ICD-10-CM | POA: Diagnosis not present

## 2022-09-26 DIAGNOSIS — H40013 Open angle with borderline findings, low risk, bilateral: Secondary | ICD-10-CM | POA: Diagnosis not present

## 2022-09-26 DIAGNOSIS — H04412 Chronic dacryocystitis of left lacrimal passage: Secondary | ICD-10-CM | POA: Diagnosis not present

## 2022-10-22 DIAGNOSIS — F419 Anxiety disorder, unspecified: Secondary | ICD-10-CM | POA: Diagnosis not present

## 2022-10-22 DIAGNOSIS — E559 Vitamin D deficiency, unspecified: Secondary | ICD-10-CM | POA: Diagnosis not present

## 2022-10-22 DIAGNOSIS — D352 Benign neoplasm of pituitary gland: Secondary | ICD-10-CM | POA: Diagnosis not present

## 2022-10-22 DIAGNOSIS — E781 Pure hyperglyceridemia: Secondary | ICD-10-CM | POA: Diagnosis not present

## 2022-10-22 DIAGNOSIS — M810 Age-related osteoporosis without current pathological fracture: Secondary | ICD-10-CM | POA: Diagnosis not present

## 2022-10-22 DIAGNOSIS — Z1212 Encounter for screening for malignant neoplasm of rectum: Secondary | ICD-10-CM | POA: Diagnosis not present

## 2022-10-25 DIAGNOSIS — H04412 Chronic dacryocystitis of left lacrimal passage: Secondary | ICD-10-CM | POA: Diagnosis not present

## 2022-10-25 DIAGNOSIS — H2513 Age-related nuclear cataract, bilateral: Secondary | ICD-10-CM | POA: Diagnosis not present

## 2022-10-25 DIAGNOSIS — H40013 Open angle with borderline findings, low risk, bilateral: Secondary | ICD-10-CM | POA: Diagnosis not present

## 2022-10-29 DIAGNOSIS — N814 Uterovaginal prolapse, unspecified: Secondary | ICD-10-CM | POA: Diagnosis not present

## 2022-10-29 DIAGNOSIS — F419 Anxiety disorder, unspecified: Secondary | ICD-10-CM | POA: Diagnosis not present

## 2022-10-29 DIAGNOSIS — E559 Vitamin D deficiency, unspecified: Secondary | ICD-10-CM | POA: Diagnosis not present

## 2022-10-29 DIAGNOSIS — N39498 Other specified urinary incontinence: Secondary | ICD-10-CM | POA: Diagnosis not present

## 2022-10-29 DIAGNOSIS — D352 Benign neoplasm of pituitary gland: Secondary | ICD-10-CM | POA: Diagnosis not present

## 2022-10-29 DIAGNOSIS — Z0000 Encounter for general adult medical examination without abnormal findings: Secondary | ICD-10-CM | POA: Diagnosis not present

## 2022-10-29 DIAGNOSIS — R002 Palpitations: Secondary | ICD-10-CM | POA: Diagnosis not present

## 2022-10-29 DIAGNOSIS — M810 Age-related osteoporosis without current pathological fracture: Secondary | ICD-10-CM | POA: Diagnosis not present

## 2022-10-29 DIAGNOSIS — R030 Elevated blood-pressure reading, without diagnosis of hypertension: Secondary | ICD-10-CM | POA: Diagnosis not present

## 2022-10-29 DIAGNOSIS — R82998 Other abnormal findings in urine: Secondary | ICD-10-CM | POA: Diagnosis not present

## 2022-10-29 DIAGNOSIS — Z1331 Encounter for screening for depression: Secondary | ICD-10-CM | POA: Diagnosis not present

## 2022-10-29 DIAGNOSIS — E781 Pure hyperglyceridemia: Secondary | ICD-10-CM | POA: Diagnosis not present

## 2022-10-29 DIAGNOSIS — H9193 Unspecified hearing loss, bilateral: Secondary | ICD-10-CM | POA: Diagnosis not present

## 2022-11-27 ENCOUNTER — Ambulatory Visit
Admission: RE | Admit: 2022-11-27 | Discharge: 2022-11-27 | Disposition: A | Discharge status: Home / Self Care | Payer: PPO | Source: Ambulatory Visit | Attending: Internal Medicine | Admitting: Internal Medicine

## 2022-11-27 DIAGNOSIS — Z1231 Encounter for screening mammogram for malignant neoplasm of breast: Secondary | ICD-10-CM

## 2022-11-29 ENCOUNTER — Other Ambulatory Visit: Payer: Self-pay | Admitting: Internal Medicine

## 2022-11-29 DIAGNOSIS — R928 Other abnormal and inconclusive findings on diagnostic imaging of breast: Secondary | ICD-10-CM

## 2022-12-03 ENCOUNTER — Ambulatory Visit
Admission: RE | Admit: 2022-12-03 | Discharge: 2022-12-03 | Disposition: A | Discharge status: Home / Self Care | Payer: PPO | Source: Ambulatory Visit | Attending: Internal Medicine | Admitting: Internal Medicine

## 2022-12-03 ENCOUNTER — Ambulatory Visit: Payer: PPO

## 2022-12-03 DIAGNOSIS — R928 Other abnormal and inconclusive findings on diagnostic imaging of breast: Secondary | ICD-10-CM

## 2023-04-24 DIAGNOSIS — H04412 Chronic dacryocystitis of left lacrimal passage: Secondary | ICD-10-CM | POA: Diagnosis not present

## 2023-04-24 DIAGNOSIS — H2513 Age-related nuclear cataract, bilateral: Secondary | ICD-10-CM | POA: Diagnosis not present

## 2023-04-24 DIAGNOSIS — H40013 Open angle with borderline findings, low risk, bilateral: Secondary | ICD-10-CM | POA: Diagnosis not present

## 2023-07-31 DIAGNOSIS — H903 Sensorineural hearing loss, bilateral: Secondary | ICD-10-CM | POA: Diagnosis not present

## 2023-08-14 DIAGNOSIS — H903 Sensorineural hearing loss, bilateral: Secondary | ICD-10-CM | POA: Diagnosis not present

## 2023-10-25 DIAGNOSIS — E781 Pure hyperglyceridemia: Secondary | ICD-10-CM | POA: Diagnosis not present

## 2023-10-25 DIAGNOSIS — E559 Vitamin D deficiency, unspecified: Secondary | ICD-10-CM | POA: Diagnosis not present

## 2023-11-01 DIAGNOSIS — R82998 Other abnormal findings in urine: Secondary | ICD-10-CM | POA: Diagnosis not present

## 2023-11-01 DIAGNOSIS — Z1331 Encounter for screening for depression: Secondary | ICD-10-CM | POA: Diagnosis not present

## 2023-11-01 DIAGNOSIS — Z1339 Encounter for screening examination for other mental health and behavioral disorders: Secondary | ICD-10-CM | POA: Diagnosis not present

## 2023-11-01 DIAGNOSIS — N814 Uterovaginal prolapse, unspecified: Secondary | ICD-10-CM | POA: Diagnosis not present

## 2023-11-01 DIAGNOSIS — R030 Elevated blood-pressure reading, without diagnosis of hypertension: Secondary | ICD-10-CM | POA: Diagnosis not present

## 2023-11-01 DIAGNOSIS — E559 Vitamin D deficiency, unspecified: Secondary | ICD-10-CM | POA: Diagnosis not present

## 2023-11-01 DIAGNOSIS — F419 Anxiety disorder, unspecified: Secondary | ICD-10-CM | POA: Diagnosis not present

## 2023-11-01 DIAGNOSIS — N39498 Other specified urinary incontinence: Secondary | ICD-10-CM | POA: Diagnosis not present

## 2023-11-01 DIAGNOSIS — M810 Age-related osteoporosis without current pathological fracture: Secondary | ICD-10-CM | POA: Diagnosis not present

## 2023-11-01 DIAGNOSIS — D352 Benign neoplasm of pituitary gland: Secondary | ICD-10-CM | POA: Diagnosis not present

## 2023-11-01 DIAGNOSIS — E781 Pure hyperglyceridemia: Secondary | ICD-10-CM | POA: Diagnosis not present

## 2023-11-01 DIAGNOSIS — Z0000 Encounter for general adult medical examination without abnormal findings: Secondary | ICD-10-CM | POA: Diagnosis not present

## 2023-11-25 DIAGNOSIS — Z1212 Encounter for screening for malignant neoplasm of rectum: Secondary | ICD-10-CM | POA: Diagnosis not present

## 2023-12-05 ENCOUNTER — Other Ambulatory Visit: Payer: Self-pay | Admitting: Internal Medicine

## 2023-12-05 DIAGNOSIS — Z1231 Encounter for screening mammogram for malignant neoplasm of breast: Secondary | ICD-10-CM

## 2023-12-24 ENCOUNTER — Ambulatory Visit
Admission: RE | Admit: 2023-12-24 | Discharge: 2023-12-24 | Disposition: A | Discharge status: Home / Self Care | Payer: PPO | Source: Ambulatory Visit | Attending: Internal Medicine | Admitting: Internal Medicine

## 2023-12-24 DIAGNOSIS — Z1231 Encounter for screening mammogram for malignant neoplasm of breast: Secondary | ICD-10-CM

## 2023-12-27 ENCOUNTER — Other Ambulatory Visit: Payer: Self-pay | Admitting: Internal Medicine

## 2023-12-27 DIAGNOSIS — R928 Other abnormal and inconclusive findings on diagnostic imaging of breast: Secondary | ICD-10-CM

## 2024-01-02 ENCOUNTER — Other Ambulatory Visit: Payer: PPO

## 2024-01-03 ENCOUNTER — Ambulatory Visit
Admission: RE | Admit: 2024-01-03 | Discharge: 2024-01-03 | Disposition: A | Discharge status: Home / Self Care | Payer: PPO | Source: Ambulatory Visit | Attending: Internal Medicine | Admitting: Internal Medicine

## 2024-01-03 DIAGNOSIS — R928 Other abnormal and inconclusive findings on diagnostic imaging of breast: Secondary | ICD-10-CM

## 2024-01-03 DIAGNOSIS — N6315 Unspecified lump in the right breast, overlapping quadrants: Secondary | ICD-10-CM | POA: Diagnosis not present

## 2024-01-03 DIAGNOSIS — N6011 Diffuse cystic mastopathy of right breast: Secondary | ICD-10-CM | POA: Diagnosis not present

## 2024-05-29 DIAGNOSIS — N819 Female genital prolapse, unspecified: Secondary | ICD-10-CM | POA: Diagnosis not present

## 2024-05-29 DIAGNOSIS — N393 Stress incontinence (female) (male): Secondary | ICD-10-CM | POA: Diagnosis not present

## 2024-05-29 DIAGNOSIS — Z4689 Encounter for fitting and adjustment of other specified devices: Secondary | ICD-10-CM | POA: Diagnosis not present

## 2024-05-29 DIAGNOSIS — N958 Other specified menopausal and perimenopausal disorders: Secondary | ICD-10-CM | POA: Diagnosis not present

## 2024-06-15 DIAGNOSIS — H52203 Unspecified astigmatism, bilateral: Secondary | ICD-10-CM | POA: Diagnosis not present

## 2024-06-15 DIAGNOSIS — H25812 Combined forms of age-related cataract, left eye: Secondary | ICD-10-CM | POA: Diagnosis not present

## 2024-06-15 DIAGNOSIS — H524 Presbyopia: Secondary | ICD-10-CM | POA: Diagnosis not present

## 2024-06-15 DIAGNOSIS — H5203 Hypermetropia, bilateral: Secondary | ICD-10-CM | POA: Diagnosis not present

## 2024-06-15 DIAGNOSIS — H35363 Drusen (degenerative) of macula, bilateral: Secondary | ICD-10-CM | POA: Diagnosis not present
